# Patient Record
Sex: Female | Born: 1950
Health system: Southern US, Community
[De-identification: ages and names within clinical notes are randomized; demographics above are authoritative.]

---

## 2011-09-16 ENCOUNTER — Inpatient Hospital Stay (INDEPENDENT_AMBULATORY_CARE_PROVIDER_SITE_OTHER)
Admission: RE | Admit: 2011-09-16 | Discharge: 2011-09-16 | Disposition: A | Discharge status: Home / Self Care | Payer: Self-pay | Source: Ambulatory Visit | Attending: Family Medicine | Admitting: Family Medicine

## 2011-09-16 DIAGNOSIS — N390 Urinary tract infection, site not specified: Secondary | ICD-10-CM

## 2011-09-16 LAB — POCT URINALYSIS DIP (DEVICE)
Nitrite: NEGATIVE
Protein, ur: 30 mg/dL — AB
pH: 6 (ref 5.0–8.0)

## 2011-12-22 ENCOUNTER — Ambulatory Visit (INDEPENDENT_AMBULATORY_CARE_PROVIDER_SITE_OTHER)

## 2011-12-22 DIAGNOSIS — J019 Acute sinusitis, unspecified: Secondary | ICD-10-CM

## 2011-12-22 DIAGNOSIS — Z13220 Encounter for screening for lipoid disorders: Secondary | ICD-10-CM

## 2014-03-20 ENCOUNTER — Emergency Department (HOSPITAL_BASED_OUTPATIENT_CLINIC_OR_DEPARTMENT_OTHER)

## 2014-03-20 ENCOUNTER — Encounter (HOSPITAL_BASED_OUTPATIENT_CLINIC_OR_DEPARTMENT_OTHER): Payer: Self-pay | Admitting: Emergency Medicine

## 2014-03-20 ENCOUNTER — Emergency Department (HOSPITAL_BASED_OUTPATIENT_CLINIC_OR_DEPARTMENT_OTHER)
Admission: EM | Admit: 2014-03-20 | Discharge: 2014-03-20 | Disposition: A | Discharge status: Home / Self Care | Attending: Emergency Medicine | Admitting: Emergency Medicine

## 2014-03-20 DIAGNOSIS — R079 Chest pain, unspecified: Secondary | ICD-10-CM

## 2014-03-20 DIAGNOSIS — Z8639 Personal history of other endocrine, nutritional and metabolic disease: Secondary | ICD-10-CM | POA: Insufficient documentation

## 2014-03-20 DIAGNOSIS — Z862 Personal history of diseases of the blood and blood-forming organs and certain disorders involving the immune mechanism: Secondary | ICD-10-CM | POA: Insufficient documentation

## 2014-03-20 DIAGNOSIS — R072 Precordial pain: Secondary | ICD-10-CM | POA: Insufficient documentation

## 2014-03-20 LAB — COMPREHENSIVE METABOLIC PANEL
ALBUMIN: 3.7 g/dL (ref 3.5–5.2)
ALK PHOS: 55 U/L (ref 39–117)
ALT: 21 U/L (ref 0–35)
AST: 24 U/L (ref 0–37)
BILIRUBIN TOTAL: 0.2 mg/dL — AB (ref 0.3–1.2)
BUN: 18 mg/dL (ref 6–23)
CHLORIDE: 106 meq/L (ref 96–112)
CO2: 25 meq/L (ref 19–32)
CREATININE: 0.9 mg/dL (ref 0.50–1.10)
Calcium: 9.5 mg/dL (ref 8.4–10.5)
GFR calc Af Amer: 78 mL/min — ABNORMAL LOW (ref >90)
GFR, EST NON AFRICAN AMERICAN: 67 mL/min — AB (ref >90)
Glucose, Bld: 87 mg/dL (ref 70–99)
POTASSIUM: 4.6 meq/L (ref 3.7–5.3)
Sodium: 142 mEq/L (ref 137–147)
Total Protein: 7.4 g/dL (ref 6.0–8.3)

## 2014-03-20 LAB — CBC
HEMATOCRIT: 36.2 % (ref 36.0–46.0)
Hemoglobin: 12.5 g/dL (ref 12.0–15.0)
MCH: 26.5 pg (ref 26.0–34.0)
MCHC: 34.5 g/dL (ref 30.0–36.0)
MCV: 76.7 fL — AB (ref 78.0–100.0)
Platelets: 182 10*3/uL (ref 150–400)
RBC: 4.72 MIL/uL (ref 3.87–5.11)
RDW: 13.7 % (ref 11.5–15.5)
WBC: 5.1 10*3/uL (ref 4.0–10.5)

## 2014-03-20 LAB — PROTIME-INR
INR: 0.94 (ref 0.00–1.49)
PROTHROMBIN TIME: 12.4 s (ref 11.6–15.2)

## 2014-03-20 LAB — TROPONIN I: Troponin I: <0.3 ng/mL (ref <0.30)

## 2014-03-20 LAB — APTT: APTT: 36 s (ref 24–37)

## 2014-03-20 MED ORDER — SODIUM CHLORIDE 0.9 % IV SOLN
1000.0000 mL | INTRAVENOUS | Status: DC
  Administered 2014-03-20: 1000 mL via INTRAVENOUS

## 2014-03-20 MED ORDER — ASPIRIN 81 MG PO CHEW: 81.0000 mg | CHEWABLE_TABLET | Freq: Every day | ORAL | Status: AC

## 2014-03-20 MED ORDER — ASPIRIN 81 MG PO CHEW
324.0000 mg | CHEWABLE_TABLET | Freq: Once | ORAL | Status: AC
  Administered 2014-03-20: 324 mg via ORAL
  Filled 2014-03-20: qty 4

## 2014-03-20 NOTE — ED Notes (Signed)
Patient transported to X-ray 

## 2014-03-20 NOTE — ED Provider Notes (Signed)
CSN: [id number]     Arrival date & time 03/20/14  1519 History   First MD Initiated Contact with Patient 03/20/14 1535     Chief Complaint  Patient presents with  . Chest Pain    Patient is a 63 y.o. female presenting with chest pain. The history is provided by the patient.  Chest Pain Pain location:  Substernal area Pain quality comment:  It feels heavy, she feels her heart swooshing and it feels sharp Pain radiates to:  L jaw and L arm Pain radiates to the back: no   Pain severity:  Moderate Duration:  2 weeks (It started two weeks ago.  It lasts a few seconds a time.   It happens a few times per day.) Timing:  Intermittent (However she has had a constant dull pain for the last two weeks that has not gone away.) Chronicity:  New Exacerbated by: not deep breathing or movements. She also noticed that her left side started to feel numb.  History reviewed. No pertinent past medical history. History reviewed. No pertinent past surgical history. History reviewed. No pertinent family history. History  Substance Use Topics  . Smoking status: Never Smoker   . Smokeless tobacco: Not on file  . Alcohol Use: Not on file   OB History   Grav Para Term Preterm Abortions TAB SAB Ect Mult Living                 Review of Systems  Cardiovascular: Positive for chest pain.  All other systems reviewed and are negative.     Allergies  Review of patient's allergies indicates no known allergies.  Home Medications   Current Outpatient Rx  Name  Route  Sig  Dispense  Refill  . aspirin 81 MG chewable tablet   Oral   Chew 1 tablet (81 mg total) by mouth daily.   30 tablet   0    BP 145/59  Pulse 75  Temp(Src) 97.9 F (36.6 C) (Oral)  Resp 18  Ht 5\' 2"  (1.575 m)  Wt 130 lb (58.968 kg)  BMI 23.77 kg/m2  SpO2 100% Physical Exam  Nursing note and vitals reviewed. Constitutional: She appears well-developed and well-nourished. No distress.  HENT:  Head: Normocephalic and  atraumatic.  Right Ear: External ear normal.  Left Ear: External ear normal.  Eyes: Conjunctivae are normal. Right eye exhibits no discharge. Left eye exhibits no discharge. No scleral icterus.  Neck: Neck supple. No tracheal deviation present.  Cardiovascular: Normal rate, regular rhythm and intact distal pulses.   Pulmonary/Chest: Effort normal and breath sounds normal. No stridor. No respiratory distress. She has no wheezes. She has no rales.  Abdominal: Soft. Bowel sounds are normal. She exhibits no distension. There is no tenderness. There is no rebound and no guarding.  Musculoskeletal: She exhibits no edema and no tenderness.  Neurological: She is alert. She has normal strength. No cranial nerve deficit (no facial droop, extraocular movements intact, no slurred speech) or sensory deficit. She exhibits normal muscle tone. She displays no seizure activity. Coordination normal.  Skin: Skin is warm and dry. No rash noted.  Psychiatric: She has a normal mood and affect.    ED Course  Procedures (including critical care time) Labs Review Labs Reviewed  CBC - Abnormal; Notable for the following:    MCV 76.7 (*)    All other components within normal limits  COMPREHENSIVE METABOLIC PANEL - Abnormal; Notable for the following:    Total Bilirubin 0.2 (*)  GFR calc non Af Amer 67 (*)    GFR calc Af Amer 78 (*)    All other components within normal limits  APTT  PROTIME-INR  TROPONIN I   Imaging Review Dg Chest 2 View  03/20/2014   CLINICAL DATA:  CHEST PAIN  EXAM: CHEST  2 VIEW  COMPARISON:  None.  FINDINGS: The heart size and mediastinal contours are within normal limits. Both lungs are clear. The visualized skeletal structures are unremarkable.  IMPRESSION: No active cardiopulmonary disease.   Electronically Signed   By: Salome HolmesHector  Cooper M.D.   On: 03/20/2014 16:43     EKG Interpretation   Date/Time:  Friday March 20 2014 15:31:09 EDT Ventricular Rate:  64 PR Interval:  128 QRS  Duration: 102 QT Interval:  418 QTC Calculation: 431 R Axis:   87 Text Interpretation:  Normal sinus rhythm Normal ECG No old tracing to  compare Confirmed by Derl Abalos  MD-J, Keiyana Stehr ([id number]) on 03/20/2014 3:38:24 PM      MDM   Final diagnoses:  Chest pain    The patient describes atypical symptoms of chest pain. Initially she was describing intermittent sharp episodes associated with pressure and fluttering in her chest. Her she has had constant chest discomfort for the last 2 that has never gone away. Patient has minimal cardiac risk factors. She does not smoke. There is no family history. She does not have hypertension or diabetes. She does have history of high cholesterol but does not take any medications for it.  With her normal EKG and cardiac enzymes and her mild symptoms I have low suspicion for acute coronary syndrome. I doubt pulmonary embolism.  At this time there does not appear to be any evidence of an acute emergency medical condition and the patient appears stable for discharge with appropriate outpatient follow up.     Celene KrasJon R Kiannah Grunow, MD 03/20/14 [phone]

## 2014-03-20 NOTE — Discharge Instructions (Signed)
Chest Pain (Nonspecific) °It is often hard to give a specific diagnosis for the cause of chest pain. There is always a chance that your pain could be related to something serious, such as a heart attack or a blood clot in the lungs. You need to follow up with your caregiver for further evaluation. °CAUSES  °· Heartburn. °· Pneumonia or bronchitis. °· Anxiety or stress. °· Inflammation around your heart (pericarditis) or lung (pleuritis or pleurisy). °· A blood clot in the lung. °· A collapsed lung (pneumothorax). It can develop suddenly on its own (spontaneous pneumothorax) or from injury (trauma) to the chest. °· Shingles infection (herpes zoster virus). °The chest wall is composed of bones, muscles, and cartilage. Any of these can be the source of the pain. °· The bones can be bruised by injury. °· The muscles or cartilage can be strained by coughing or overwork. °· The cartilage can be affected by inflammation and become sore (costochondritis). °DIAGNOSIS  °Lab tests or other studies, such as X-rays, electrocardiography, stress testing, or cardiac imaging, may be needed to find the cause of your pain.  °TREATMENT  °· Treatment depends on what may be causing your chest pain. Treatment may include: °· Acid blockers for heartburn. °· Anti-inflammatory medicine. °· Pain medicine for inflammatory conditions. °· Antibiotics if an infection is present. °· You may be advised to change lifestyle habits. This includes stopping smoking and avoiding alcohol, caffeine, and chocolate. °· You may be advised to keep your head raised (elevated) when sleeping. This reduces the chance of acid going backward from your stomach into your esophagus. °· Most of the time, nonspecific chest pain will improve within 2 to 3 days with rest and mild pain medicine. °HOME CARE INSTRUCTIONS  °· If antibiotics were prescribed, take your antibiotics as directed. Finish them even if you start to feel better. °· For the next few days, avoid physical  activities that bring on chest pain. Continue physical activities as directed. °· Do not smoke. °· Avoid drinking alcohol. °· Only take over-the-counter or prescription medicine for pain, discomfort, or fever as directed by your caregiver. °· Follow your caregiver's suggestions for further testing if your chest pain does not go away. °· Keep any follow-up appointments you made. If you do not go to an appointment, you could develop lasting (chronic) problems with pain. If there is any problem keeping an appointment, you must call to reschedule. °SEEK MEDICAL CARE IF:  °· You think you are having problems from the medicine you are taking. Read your medicine instructions carefully. °· Your chest pain does not go away, even after treatment. °· You develop a rash with blisters on your chest. °SEEK IMMEDIATE MEDICAL CARE IF:  °· You have increased chest pain or pain that spreads to your arm, neck, jaw, back, or abdomen. °· You develop shortness of breath, an increasing cough, or you are coughing up blood. °· You have severe back or abdominal pain, feel nauseous, or vomit. °· You develop severe weakness, fainting, or chills. °· You have a fever. °THIS IS AN EMERGENCY. Do not wait to see if the pain will go away. Get medical help at once. Call your local emergency services (911 in [place] Do not drive yourself to the hospital. °MAKE SURE YOU:  °· Understand these instructions. °· Will watch your condition. °· Will get help right away if you are not doing well or get worse. °Document Released: 09/06/2005 Document Revised: 02/19/2012 Document Reviewed: 07/02/2008 °[employer]® Patient Information ©2014 [employer]. ° °

## 2014-03-20 NOTE — ED Notes (Signed)
Pt amb to room 9 with quick steady gait in nad. Pt reports 2 weeks of intermittent chest pain, "Heavy", pt reports left sided cp radiating to left jaw and left shoulder, and back. ekg in progress, iv access obtained.

## 2016-02-26 LAB — POCT LIPID PANEL
HDL: 83
LDL: 126
TC: 232
TRG: 116

## 2016-02-26 LAB — GLUCOSE, POCT (MANUAL RESULT ENTRY): POC GLUCOSE: 83 mg/dL (ref 70–99)

## 2016-12-18 DIAGNOSIS — M25561 Pain in right knee: Secondary | ICD-10-CM | POA: Diagnosis not present

## 2016-12-18 DIAGNOSIS — M25562 Pain in left knee: Secondary | ICD-10-CM | POA: Diagnosis not present

## 2016-12-18 DIAGNOSIS — J302 Other seasonal allergic rhinitis: Secondary | ICD-10-CM | POA: Diagnosis not present

## 2016-12-18 DIAGNOSIS — M25541 Pain in joints of right hand: Secondary | ICD-10-CM | POA: Diagnosis not present

## 2016-12-18 DIAGNOSIS — E559 Vitamin D deficiency, unspecified: Secondary | ICD-10-CM | POA: Diagnosis not present

## 2016-12-18 DIAGNOSIS — E785 Hyperlipidemia, unspecified: Secondary | ICD-10-CM | POA: Diagnosis not present

## 2016-12-18 DIAGNOSIS — F418 Other specified anxiety disorders: Secondary | ICD-10-CM | POA: Diagnosis not present

## 2016-12-18 DIAGNOSIS — K219 Gastro-esophageal reflux disease without esophagitis: Secondary | ICD-10-CM | POA: Diagnosis not present

## 2017-01-10 DIAGNOSIS — H52223 Regular astigmatism, bilateral: Secondary | ICD-10-CM | POA: Diagnosis not present

## 2017-01-10 DIAGNOSIS — H524 Presbyopia: Secondary | ICD-10-CM | POA: Diagnosis not present

## 2017-01-10 DIAGNOSIS — H2513 Age-related nuclear cataract, bilateral: Secondary | ICD-10-CM | POA: Diagnosis not present

## 2017-01-29 DIAGNOSIS — E559 Vitamin D deficiency, unspecified: Secondary | ICD-10-CM | POA: Diagnosis not present

## 2017-01-29 DIAGNOSIS — F418 Other specified anxiety disorders: Secondary | ICD-10-CM | POA: Diagnosis not present

## 2017-01-29 DIAGNOSIS — Z131 Encounter for screening for diabetes mellitus: Secondary | ICD-10-CM | POA: Diagnosis not present

## 2017-01-29 DIAGNOSIS — K219 Gastro-esophageal reflux disease without esophagitis: Secondary | ICD-10-CM | POA: Diagnosis not present

## 2017-01-29 DIAGNOSIS — E785 Hyperlipidemia, unspecified: Secondary | ICD-10-CM | POA: Diagnosis not present

## 2017-01-29 DIAGNOSIS — Z0000 Encounter for general adult medical examination without abnormal findings: Secondary | ICD-10-CM | POA: Diagnosis not present

## 2017-01-29 DIAGNOSIS — J302 Other seasonal allergic rhinitis: Secondary | ICD-10-CM | POA: Diagnosis not present

## 2017-01-29 DIAGNOSIS — R51 Headache: Secondary | ICD-10-CM | POA: Diagnosis not present

## 2017-05-10 DIAGNOSIS — R300 Dysuria: Secondary | ICD-10-CM | POA: Diagnosis not present

## 2017-05-10 DIAGNOSIS — E559 Vitamin D deficiency, unspecified: Secondary | ICD-10-CM | POA: Diagnosis not present

## 2017-05-10 DIAGNOSIS — R110 Nausea: Secondary | ICD-10-CM | POA: Diagnosis not present

## 2017-05-10 DIAGNOSIS — J329 Chronic sinusitis, unspecified: Secondary | ICD-10-CM | POA: Diagnosis not present

## 2017-05-10 DIAGNOSIS — F418 Other specified anxiety disorders: Secondary | ICD-10-CM | POA: Diagnosis not present

## 2017-05-10 DIAGNOSIS — K219 Gastro-esophageal reflux disease without esophagitis: Secondary | ICD-10-CM | POA: Diagnosis not present

## 2017-05-10 DIAGNOSIS — E785 Hyperlipidemia, unspecified: Secondary | ICD-10-CM | POA: Diagnosis not present

## 2017-05-10 DIAGNOSIS — J302 Other seasonal allergic rhinitis: Secondary | ICD-10-CM | POA: Diagnosis not present

## 2017-06-26 DIAGNOSIS — Z131 Encounter for screening for diabetes mellitus: Secondary | ICD-10-CM | POA: Diagnosis not present

## 2017-06-26 DIAGNOSIS — R3915 Urgency of urination: Secondary | ICD-10-CM | POA: Diagnosis not present

## 2017-06-26 DIAGNOSIS — G44209 Tension-type headache, unspecified, not intractable: Secondary | ICD-10-CM | POA: Diagnosis not present

## 2017-06-26 DIAGNOSIS — Z0110 Encounter for examination of ears and hearing without abnormal findings: Secondary | ICD-10-CM | POA: Diagnosis not present

## 2017-06-26 DIAGNOSIS — E785 Hyperlipidemia, unspecified: Secondary | ICD-10-CM | POA: Diagnosis not present

## 2017-06-26 DIAGNOSIS — J302 Other seasonal allergic rhinitis: Secondary | ICD-10-CM | POA: Diagnosis not present

## 2017-06-26 DIAGNOSIS — K219 Gastro-esophageal reflux disease without esophagitis: Secondary | ICD-10-CM | POA: Diagnosis not present

## 2017-06-26 DIAGNOSIS — E559 Vitamin D deficiency, unspecified: Secondary | ICD-10-CM | POA: Diagnosis not present

## 2017-06-26 DIAGNOSIS — H9209 Otalgia, unspecified ear: Secondary | ICD-10-CM | POA: Diagnosis not present

## 2017-06-26 DIAGNOSIS — R002 Palpitations: Secondary | ICD-10-CM | POA: Diagnosis not present

## 2017-06-26 DIAGNOSIS — F418 Other specified anxiety disorders: Secondary | ICD-10-CM | POA: Diagnosis not present

## 2017-06-26 DIAGNOSIS — H539 Unspecified visual disturbance: Secondary | ICD-10-CM | POA: Diagnosis not present

## 2017-06-26 DIAGNOSIS — R9431 Abnormal electrocardiogram [ECG] [EKG]: Secondary | ICD-10-CM | POA: Diagnosis not present

## 2017-07-17 DIAGNOSIS — E785 Hyperlipidemia, unspecified: Secondary | ICD-10-CM | POA: Diagnosis not present

## 2017-07-17 DIAGNOSIS — K219 Gastro-esophageal reflux disease without esophagitis: Secondary | ICD-10-CM | POA: Diagnosis not present

## 2017-07-17 DIAGNOSIS — R740 Nonspecific elevation of levels of transaminase and lactic acid dehydrogenase [LDH]: Secondary | ICD-10-CM | POA: Diagnosis not present

## 2017-07-17 DIAGNOSIS — E559 Vitamin D deficiency, unspecified: Secondary | ICD-10-CM | POA: Diagnosis not present

## 2017-07-17 DIAGNOSIS — J302 Other seasonal allergic rhinitis: Secondary | ICD-10-CM | POA: Diagnosis not present

## 2017-07-17 DIAGNOSIS — F418 Other specified anxiety disorders: Secondary | ICD-10-CM | POA: Diagnosis not present

## 2017-07-17 DIAGNOSIS — R7989 Other specified abnormal findings of blood chemistry: Secondary | ICD-10-CM | POA: Diagnosis not present

## 2017-07-17 DIAGNOSIS — G44209 Tension-type headache, unspecified, not intractable: Secondary | ICD-10-CM | POA: Diagnosis not present

## 2017-07-20 DIAGNOSIS — E559 Vitamin D deficiency, unspecified: Secondary | ICD-10-CM | POA: Diagnosis not present

## 2017-07-20 DIAGNOSIS — F418 Other specified anxiety disorders: Secondary | ICD-10-CM | POA: Diagnosis not present

## 2017-07-20 DIAGNOSIS — E875 Hyperkalemia: Secondary | ICD-10-CM | POA: Diagnosis not present

## 2017-07-20 DIAGNOSIS — R7989 Other specified abnormal findings of blood chemistry: Secondary | ICD-10-CM | POA: Diagnosis not present

## 2017-07-20 DIAGNOSIS — K219 Gastro-esophageal reflux disease without esophagitis: Secondary | ICD-10-CM | POA: Diagnosis not present

## 2017-07-20 DIAGNOSIS — G44209 Tension-type headache, unspecified, not intractable: Secondary | ICD-10-CM | POA: Diagnosis not present

## 2017-07-20 DIAGNOSIS — R300 Dysuria: Secondary | ICD-10-CM | POA: Diagnosis not present

## 2017-07-20 DIAGNOSIS — R740 Nonspecific elevation of levels of transaminase and lactic acid dehydrogenase [LDH]: Secondary | ICD-10-CM | POA: Diagnosis not present

## 2017-07-20 DIAGNOSIS — E785 Hyperlipidemia, unspecified: Secondary | ICD-10-CM | POA: Diagnosis not present

## 2017-07-20 DIAGNOSIS — J302 Other seasonal allergic rhinitis: Secondary | ICD-10-CM | POA: Diagnosis not present

## 2017-08-02 DIAGNOSIS — Z1231 Encounter for screening mammogram for malignant neoplasm of breast: Secondary | ICD-10-CM | POA: Diagnosis not present

## 2017-08-30 DIAGNOSIS — R0609 Other forms of dyspnea: Secondary | ICD-10-CM | POA: Diagnosis not present

## 2017-08-30 DIAGNOSIS — R9431 Abnormal electrocardiogram [ECG] [EKG]: Secondary | ICD-10-CM | POA: Diagnosis not present

## 2017-08-31 DIAGNOSIS — Z23 Encounter for immunization: Secondary | ICD-10-CM | POA: Diagnosis not present

## 2017-09-04 DIAGNOSIS — R740 Nonspecific elevation of levels of transaminase and lactic acid dehydrogenase [LDH]: Secondary | ICD-10-CM | POA: Diagnosis not present

## 2017-09-04 DIAGNOSIS — J302 Other seasonal allergic rhinitis: Secondary | ICD-10-CM | POA: Diagnosis not present

## 2017-09-04 DIAGNOSIS — E785 Hyperlipidemia, unspecified: Secondary | ICD-10-CM | POA: Diagnosis not present

## 2017-09-04 DIAGNOSIS — F418 Other specified anxiety disorders: Secondary | ICD-10-CM | POA: Diagnosis not present

## 2017-09-04 DIAGNOSIS — K921 Melena: Secondary | ICD-10-CM | POA: Diagnosis not present

## 2017-09-04 DIAGNOSIS — G44209 Tension-type headache, unspecified, not intractable: Secondary | ICD-10-CM | POA: Diagnosis not present

## 2017-09-04 DIAGNOSIS — K219 Gastro-esophageal reflux disease without esophagitis: Secondary | ICD-10-CM | POA: Diagnosis not present

## 2017-09-04 DIAGNOSIS — J329 Chronic sinusitis, unspecified: Secondary | ICD-10-CM | POA: Diagnosis not present

## 2017-09-04 DIAGNOSIS — E559 Vitamin D deficiency, unspecified: Secondary | ICD-10-CM | POA: Diagnosis not present

## 2017-09-11 DIAGNOSIS — R635 Abnormal weight gain: Secondary | ICD-10-CM | POA: Diagnosis not present

## 2017-09-11 DIAGNOSIS — K602 Anal fissure, unspecified: Secondary | ICD-10-CM | POA: Diagnosis not present

## 2017-09-11 DIAGNOSIS — K6289 Other specified diseases of anus and rectum: Secondary | ICD-10-CM | POA: Diagnosis not present

## 2017-09-11 DIAGNOSIS — K5904 Chronic idiopathic constipation: Secondary | ICD-10-CM | POA: Diagnosis not present

## 2017-09-11 DIAGNOSIS — R110 Nausea: Secondary | ICD-10-CM | POA: Diagnosis not present

## 2017-09-11 DIAGNOSIS — K625 Hemorrhage of anus and rectum: Secondary | ICD-10-CM | POA: Diagnosis not present

## 2017-11-06 DIAGNOSIS — J329 Chronic sinusitis, unspecified: Secondary | ICD-10-CM | POA: Diagnosis not present

## 2017-11-06 DIAGNOSIS — E559 Vitamin D deficiency, unspecified: Secondary | ICD-10-CM | POA: Diagnosis not present

## 2017-11-06 DIAGNOSIS — J302 Other seasonal allergic rhinitis: Secondary | ICD-10-CM | POA: Diagnosis not present

## 2017-11-06 DIAGNOSIS — E785 Hyperlipidemia, unspecified: Secondary | ICD-10-CM | POA: Diagnosis not present

## 2017-11-06 DIAGNOSIS — R740 Nonspecific elevation of levels of transaminase and lactic acid dehydrogenase [LDH]: Secondary | ICD-10-CM | POA: Diagnosis not present

## 2017-11-06 DIAGNOSIS — F418 Other specified anxiety disorders: Secondary | ICD-10-CM | POA: Diagnosis not present

## 2017-11-06 DIAGNOSIS — G44209 Tension-type headache, unspecified, not intractable: Secondary | ICD-10-CM | POA: Diagnosis not present

## 2017-11-06 DIAGNOSIS — K219 Gastro-esophageal reflux disease without esophagitis: Secondary | ICD-10-CM | POA: Diagnosis not present

## 2017-11-06 DIAGNOSIS — R7989 Other specified abnormal findings of blood chemistry: Secondary | ICD-10-CM | POA: Diagnosis not present

## 2017-11-27 DIAGNOSIS — J302 Other seasonal allergic rhinitis: Secondary | ICD-10-CM | POA: Diagnosis not present

## 2017-11-27 DIAGNOSIS — K219 Gastro-esophageal reflux disease without esophagitis: Secondary | ICD-10-CM | POA: Diagnosis not present

## 2017-11-27 DIAGNOSIS — G44209 Tension-type headache, unspecified, not intractable: Secondary | ICD-10-CM | POA: Diagnosis not present

## 2017-11-27 DIAGNOSIS — E785 Hyperlipidemia, unspecified: Secondary | ICD-10-CM | POA: Diagnosis not present

## 2017-11-27 DIAGNOSIS — F418 Other specified anxiety disorders: Secondary | ICD-10-CM | POA: Diagnosis not present

## 2017-11-27 DIAGNOSIS — R7989 Other specified abnormal findings of blood chemistry: Secondary | ICD-10-CM | POA: Diagnosis not present

## 2017-11-27 DIAGNOSIS — R740 Nonspecific elevation of levels of transaminase and lactic acid dehydrogenase [LDH]: Secondary | ICD-10-CM | POA: Diagnosis not present

## 2017-11-27 DIAGNOSIS — E559 Vitamin D deficiency, unspecified: Secondary | ICD-10-CM | POA: Diagnosis not present

## 2017-11-27 DIAGNOSIS — J329 Chronic sinusitis, unspecified: Secondary | ICD-10-CM | POA: Diagnosis not present

## 2018-01-29 DIAGNOSIS — Z0000 Encounter for general adult medical examination without abnormal findings: Secondary | ICD-10-CM | POA: Diagnosis not present

## 2018-01-29 DIAGNOSIS — K219 Gastro-esophageal reflux disease without esophagitis: Secondary | ICD-10-CM | POA: Diagnosis not present

## 2018-01-29 DIAGNOSIS — E785 Hyperlipidemia, unspecified: Secondary | ICD-10-CM | POA: Diagnosis not present

## 2018-01-29 DIAGNOSIS — E559 Vitamin D deficiency, unspecified: Secondary | ICD-10-CM | POA: Diagnosis not present

## 2018-01-29 DIAGNOSIS — J302 Other seasonal allergic rhinitis: Secondary | ICD-10-CM | POA: Diagnosis not present

## 2018-01-29 DIAGNOSIS — F418 Other specified anxiety disorders: Secondary | ICD-10-CM | POA: Diagnosis not present

## 2018-03-18 DIAGNOSIS — F418 Other specified anxiety disorders: Secondary | ICD-10-CM | POA: Diagnosis not present

## 2018-03-18 DIAGNOSIS — K219 Gastro-esophageal reflux disease without esophagitis: Secondary | ICD-10-CM | POA: Diagnosis not present

## 2018-03-18 DIAGNOSIS — E785 Hyperlipidemia, unspecified: Secondary | ICD-10-CM | POA: Diagnosis not present

## 2018-03-18 DIAGNOSIS — E559 Vitamin D deficiency, unspecified: Secondary | ICD-10-CM | POA: Diagnosis not present

## 2018-03-18 DIAGNOSIS — J302 Other seasonal allergic rhinitis: Secondary | ICD-10-CM | POA: Diagnosis not present

## 2018-03-18 DIAGNOSIS — N390 Urinary tract infection, site not specified: Secondary | ICD-10-CM | POA: Diagnosis not present

## 2018-03-28 DIAGNOSIS — H16213 Exposure keratoconjunctivitis, bilateral: Secondary | ICD-10-CM | POA: Diagnosis not present

## 2018-03-28 DIAGNOSIS — H04123 Dry eye syndrome of bilateral lacrimal glands: Secondary | ICD-10-CM | POA: Diagnosis not present

## 2018-04-17 DIAGNOSIS — J069 Acute upper respiratory infection, unspecified: Secondary | ICD-10-CM | POA: Diagnosis not present

## 2018-04-17 DIAGNOSIS — K219 Gastro-esophageal reflux disease without esophagitis: Secondary | ICD-10-CM | POA: Diagnosis not present

## 2018-04-17 DIAGNOSIS — E785 Hyperlipidemia, unspecified: Secondary | ICD-10-CM | POA: Diagnosis not present

## 2018-04-17 DIAGNOSIS — J302 Other seasonal allergic rhinitis: Secondary | ICD-10-CM | POA: Diagnosis not present

## 2018-04-17 DIAGNOSIS — F418 Other specified anxiety disorders: Secondary | ICD-10-CM | POA: Diagnosis not present

## 2018-04-17 DIAGNOSIS — R3915 Urgency of urination: Secondary | ICD-10-CM | POA: Diagnosis not present

## 2018-04-17 DIAGNOSIS — E559 Vitamin D deficiency, unspecified: Secondary | ICD-10-CM | POA: Diagnosis not present

## 2018-05-29 DIAGNOSIS — K219 Gastro-esophageal reflux disease without esophagitis: Secondary | ICD-10-CM | POA: Diagnosis not present

## 2018-05-29 DIAGNOSIS — F418 Other specified anxiety disorders: Secondary | ICD-10-CM | POA: Diagnosis not present

## 2018-05-29 DIAGNOSIS — E559 Vitamin D deficiency, unspecified: Secondary | ICD-10-CM | POA: Diagnosis not present

## 2018-05-29 DIAGNOSIS — E785 Hyperlipidemia, unspecified: Secondary | ICD-10-CM | POA: Diagnosis not present

## 2018-05-29 DIAGNOSIS — J302 Other seasonal allergic rhinitis: Secondary | ICD-10-CM | POA: Diagnosis not present

## 2018-06-20 DIAGNOSIS — R3915 Urgency of urination: Secondary | ICD-10-CM | POA: Diagnosis not present

## 2018-06-27 DIAGNOSIS — Z136 Encounter for screening for cardiovascular disorders: Secondary | ICD-10-CM | POA: Diagnosis not present

## 2018-06-27 DIAGNOSIS — E785 Hyperlipidemia, unspecified: Secondary | ICD-10-CM | POA: Diagnosis not present

## 2018-06-27 DIAGNOSIS — Z131 Encounter for screening for diabetes mellitus: Secondary | ICD-10-CM | POA: Diagnosis not present

## 2018-06-27 DIAGNOSIS — Z0100 Encounter for examination of eyes and vision without abnormal findings: Secondary | ICD-10-CM | POA: Diagnosis not present

## 2018-06-27 DIAGNOSIS — E559 Vitamin D deficiency, unspecified: Secondary | ICD-10-CM | POA: Diagnosis not present

## 2018-06-27 DIAGNOSIS — Z1389 Encounter for screening for other disorder: Secondary | ICD-10-CM | POA: Diagnosis not present

## 2018-06-27 DIAGNOSIS — J302 Other seasonal allergic rhinitis: Secondary | ICD-10-CM | POA: Diagnosis not present

## 2018-06-27 DIAGNOSIS — K219 Gastro-esophageal reflux disease without esophagitis: Secondary | ICD-10-CM | POA: Diagnosis not present

## 2018-06-27 DIAGNOSIS — F418 Other specified anxiety disorders: Secondary | ICD-10-CM | POA: Diagnosis not present

## 2018-06-27 DIAGNOSIS — Z0110 Encounter for examination of ears and hearing without abnormal findings: Secondary | ICD-10-CM | POA: Diagnosis not present

## 2018-07-03 ENCOUNTER — Emergency Department (HOSPITAL_COMMUNITY)
Admission: EM | Admit: 2018-07-03 | Discharge: 2018-07-03 | Disposition: A | Discharge status: Home / Self Care | Payer: Medicare Other | Attending: Emergency Medicine | Admitting: Emergency Medicine

## 2018-07-03 ENCOUNTER — Emergency Department (HOSPITAL_COMMUNITY): Payer: Medicare Other

## 2018-07-03 ENCOUNTER — Encounter (HOSPITAL_COMMUNITY): Payer: Self-pay | Admitting: *Deleted

## 2018-07-03 DIAGNOSIS — Y999 Unspecified external cause status: Secondary | ICD-10-CM | POA: Diagnosis not present

## 2018-07-03 DIAGNOSIS — R0789 Other chest pain: Secondary | ICD-10-CM | POA: Diagnosis not present

## 2018-07-03 DIAGNOSIS — Z7982 Long term (current) use of aspirin: Secondary | ICD-10-CM | POA: Diagnosis not present

## 2018-07-03 DIAGNOSIS — S40021A Contusion of right upper arm, initial encounter: Secondary | ICD-10-CM | POA: Diagnosis not present

## 2018-07-03 DIAGNOSIS — S40022A Contusion of left upper arm, initial encounter: Secondary | ICD-10-CM | POA: Diagnosis not present

## 2018-07-03 DIAGNOSIS — Y929 Unspecified place or not applicable: Secondary | ICD-10-CM | POA: Diagnosis not present

## 2018-07-03 DIAGNOSIS — R1031 Right lower quadrant pain: Secondary | ICD-10-CM | POA: Insufficient documentation

## 2018-07-03 DIAGNOSIS — S8992XA Unspecified injury of left lower leg, initial encounter: Secondary | ICD-10-CM | POA: Diagnosis not present

## 2018-07-03 DIAGNOSIS — S8002XA Contusion of left knee, initial encounter: Secondary | ICD-10-CM | POA: Diagnosis not present

## 2018-07-03 DIAGNOSIS — Z79899 Other long term (current) drug therapy: Secondary | ICD-10-CM | POA: Insufficient documentation

## 2018-07-03 DIAGNOSIS — M25562 Pain in left knee: Secondary | ICD-10-CM | POA: Diagnosis not present

## 2018-07-03 DIAGNOSIS — M7989 Other specified soft tissue disorders: Secondary | ICD-10-CM | POA: Diagnosis not present

## 2018-07-03 DIAGNOSIS — Y939 Activity, unspecified: Secondary | ICD-10-CM | POA: Insufficient documentation

## 2018-07-03 DIAGNOSIS — S20219A Contusion of unspecified front wall of thorax, initial encounter: Secondary | ICD-10-CM | POA: Diagnosis not present

## 2018-07-03 DIAGNOSIS — S301XXA Contusion of abdominal wall, initial encounter: Secondary | ICD-10-CM | POA: Diagnosis not present

## 2018-07-03 LAB — I-STAT CHEM 8, ED
BUN: 17 mg/dL (ref 8–23)
CALCIUM ION: 1.11 mmol/L — AB (ref 1.15–1.40)
CHLORIDE: 108 mmol/L (ref 98–111)
Creatinine, Ser: 0.7 mg/dL (ref 0.44–1.00)
Glucose, Bld: 90 mg/dL (ref 70–99)
HCT: 42 % (ref 36.0–46.0)
Hemoglobin: 14.3 g/dL (ref 12.0–15.0)
POTASSIUM: 5.6 mmol/L — AB (ref 3.5–5.1)
SODIUM: 137 mmol/L (ref 135–145)
TCO2: 25 mmol/L (ref 22–32)

## 2018-07-03 LAB — I-STAT BETA HCG BLOOD, ED (MC, WL, AP ONLY): I-stat hCG, quantitative: <5 m[IU]/mL

## 2018-07-03 MED ORDER — IOPAMIDOL (ISOVUE-300) INJECTION 61%
100.0000 mL | Freq: Once | INTRAVENOUS | Status: AC | PRN
  Administered 2018-07-03: 100 mL via INTRAVENOUS

## 2018-07-03 MED ORDER — ACETAMINOPHEN ER 650 MG PO TBCR: 650.0000 mg | EXTENDED_RELEASE_TABLET | Freq: Three times a day (TID) | ORAL | 0 refills | Status: AC

## 2018-07-03 MED ORDER — HYDROCODONE-ACETAMINOPHEN 5-325 MG PO TABS
1.0000 | ORAL_TABLET | Freq: Once | ORAL | Status: AC
  Administered 2018-07-03: 1 via ORAL
  Filled 2018-07-03: qty 1

## 2018-07-03 MED ORDER — IOPAMIDOL (ISOVUE-300) INJECTION 61%
INTRAVENOUS | Status: AC
  Filled 2018-07-03: qty 100

## 2018-07-03 MED ORDER — ONDANSETRON 4 MG PO TBDP
4.0000 mg | ORAL_TABLET | Freq: Once | ORAL | Status: AC
  Administered 2018-07-03: 4 mg via ORAL
  Filled 2018-07-03: qty 1

## 2018-07-03 NOTE — ED Notes (Signed)
Patient in radiology
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
## 2018-07-03 NOTE — ED Notes (Signed)
Patient transported to CT 
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
## 2018-07-03 NOTE — ED Notes (Signed)
Attempted twice to obtain blood but was unsuccessful. [name], Charity fundraiserN (Press photographercharge nurse) is going to bedside. Also, provided patient two ice packs for her left arm and left knee.

## 2018-07-10 DIAGNOSIS — K921 Melena: Secondary | ICD-10-CM | POA: Diagnosis not present

## 2018-07-10 DIAGNOSIS — J329 Chronic sinusitis, unspecified: Secondary | ICD-10-CM | POA: Diagnosis not present

## 2018-07-17 DIAGNOSIS — E559 Vitamin D deficiency, unspecified: Secondary | ICD-10-CM | POA: Diagnosis not present

## 2018-07-17 DIAGNOSIS — E785 Hyperlipidemia, unspecified: Secondary | ICD-10-CM | POA: Diagnosis not present

## 2018-07-17 DIAGNOSIS — K219 Gastro-esophageal reflux disease without esophagitis: Secondary | ICD-10-CM | POA: Diagnosis not present

## 2018-07-17 DIAGNOSIS — E038 Other specified hypothyroidism: Secondary | ICD-10-CM | POA: Diagnosis not present

## 2018-07-17 DIAGNOSIS — F418 Other specified anxiety disorders: Secondary | ICD-10-CM | POA: Diagnosis not present

## 2018-07-17 DIAGNOSIS — J302 Other seasonal allergic rhinitis: Secondary | ICD-10-CM | POA: Diagnosis not present

## 2018-09-02 DIAGNOSIS — Z1231 Encounter for screening mammogram for malignant neoplasm of breast: Secondary | ICD-10-CM | POA: Diagnosis not present
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
## 2018-09-26 DIAGNOSIS — K219 Gastro-esophageal reflux disease without esophagitis: Secondary | ICD-10-CM | POA: Diagnosis not present

## 2018-09-26 DIAGNOSIS — R05 Cough: Secondary | ICD-10-CM | POA: Diagnosis not present

## 2018-09-26 DIAGNOSIS — E785 Hyperlipidemia, unspecified: Secondary | ICD-10-CM | POA: Diagnosis not present

## 2018-09-26 DIAGNOSIS — F418 Other specified anxiety disorders: Secondary | ICD-10-CM | POA: Diagnosis not present

## 2018-09-26 DIAGNOSIS — E559 Vitamin D deficiency, unspecified: Secondary | ICD-10-CM | POA: Diagnosis not present

## 2018-09-26 DIAGNOSIS — E038 Other specified hypothyroidism: Secondary | ICD-10-CM | POA: Diagnosis not present

## 2018-09-26 DIAGNOSIS — J302 Other seasonal allergic rhinitis: Secondary | ICD-10-CM | POA: Diagnosis not present

## 2018-11-14 DIAGNOSIS — E875 Hyperkalemia: Secondary | ICD-10-CM | POA: Diagnosis not present

## 2018-11-14 DIAGNOSIS — F418 Other specified anxiety disorders: Secondary | ICD-10-CM | POA: Diagnosis not present

## 2018-11-14 DIAGNOSIS — E559 Vitamin D deficiency, unspecified: Secondary | ICD-10-CM | POA: Diagnosis not present

## 2018-11-14 DIAGNOSIS — K219 Gastro-esophageal reflux disease without esophagitis: Secondary | ICD-10-CM | POA: Diagnosis not present

## 2018-11-14 DIAGNOSIS — E7800 Pure hypercholesterolemia, unspecified: Secondary | ICD-10-CM | POA: Diagnosis not present

## 2018-11-14 DIAGNOSIS — J302 Other seasonal allergic rhinitis: Secondary | ICD-10-CM | POA: Diagnosis not present

## 2018-11-14 DIAGNOSIS — E038 Other specified hypothyroidism: Secondary | ICD-10-CM | POA: Diagnosis not present

## 2018-11-14 DIAGNOSIS — I739 Peripheral vascular disease, unspecified: Secondary | ICD-10-CM | POA: Diagnosis not present

## 2018-11-28 DIAGNOSIS — E038 Other specified hypothyroidism: Secondary | ICD-10-CM | POA: Diagnosis not present

## 2018-11-28 DIAGNOSIS — R3915 Urgency of urination: Secondary | ICD-10-CM | POA: Diagnosis not present

## 2018-11-28 DIAGNOSIS — E559 Vitamin D deficiency, unspecified: Secondary | ICD-10-CM | POA: Diagnosis not present

## 2018-11-28 DIAGNOSIS — K219 Gastro-esophageal reflux disease without esophagitis: Secondary | ICD-10-CM | POA: Diagnosis not present

## 2018-11-28 DIAGNOSIS — J302 Other seasonal allergic rhinitis: Secondary | ICD-10-CM | POA: Diagnosis not present

## 2018-11-28 DIAGNOSIS — F418 Other specified anxiety disorders: Secondary | ICD-10-CM | POA: Diagnosis not present

## 2018-11-28 DIAGNOSIS — E785 Hyperlipidemia, unspecified: Secondary | ICD-10-CM | POA: Diagnosis not present

## 2019-02-05 DIAGNOSIS — E559 Vitamin D deficiency, unspecified: Secondary | ICD-10-CM | POA: Diagnosis not present

## 2019-02-05 DIAGNOSIS — E785 Hyperlipidemia, unspecified: Secondary | ICD-10-CM | POA: Diagnosis not present

## 2019-02-05 DIAGNOSIS — R3915 Urgency of urination: Secondary | ICD-10-CM | POA: Diagnosis not present

## 2019-02-05 DIAGNOSIS — J302 Other seasonal allergic rhinitis: Secondary | ICD-10-CM | POA: Diagnosis not present

## 2019-02-05 DIAGNOSIS — Z0001 Encounter for general adult medical examination with abnormal findings: Secondary | ICD-10-CM | POA: Diagnosis not present

## 2019-02-05 DIAGNOSIS — E038 Other specified hypothyroidism: Secondary | ICD-10-CM | POA: Diagnosis not present

## 2019-02-05 DIAGNOSIS — Z0000 Encounter for general adult medical examination without abnormal findings: Secondary | ICD-10-CM | POA: Diagnosis not present

## 2019-02-05 DIAGNOSIS — M25562 Pain in left knee: Secondary | ICD-10-CM | POA: Diagnosis not present

## 2019-02-05 DIAGNOSIS — F418 Other specified anxiety disorders: Secondary | ICD-10-CM | POA: Diagnosis not present

## 2019-02-05 DIAGNOSIS — K219 Gastro-esophageal reflux disease without esophagitis: Secondary | ICD-10-CM | POA: Diagnosis not present

## 2019-02-20 DIAGNOSIS — K219 Gastro-esophageal reflux disease without esophagitis: Secondary | ICD-10-CM | POA: Diagnosis not present

## 2019-02-20 DIAGNOSIS — E559 Vitamin D deficiency, unspecified: Secondary | ICD-10-CM | POA: Diagnosis not present

## 2019-02-20 DIAGNOSIS — E038 Other specified hypothyroidism: Secondary | ICD-10-CM | POA: Diagnosis not present

## 2019-02-20 DIAGNOSIS — E785 Hyperlipidemia, unspecified: Secondary | ICD-10-CM | POA: Diagnosis not present

## 2019-02-20 DIAGNOSIS — M25562 Pain in left knee: Secondary | ICD-10-CM | POA: Diagnosis not present

## 2019-02-20 DIAGNOSIS — F418 Other specified anxiety disorders: Secondary | ICD-10-CM | POA: Diagnosis not present

## 2019-02-20 DIAGNOSIS — R3915 Urgency of urination: Secondary | ICD-10-CM | POA: Diagnosis not present

## 2019-02-20 DIAGNOSIS — J302 Other seasonal allergic rhinitis: Secondary | ICD-10-CM | POA: Diagnosis not present

## 2019-06-04 DIAGNOSIS — E7800 Pure hypercholesterolemia, unspecified: Secondary | ICD-10-CM | POA: Diagnosis not present

## 2019-06-04 DIAGNOSIS — K219 Gastro-esophageal reflux disease without esophagitis: Secondary | ICD-10-CM | POA: Diagnosis not present

## 2019-06-04 DIAGNOSIS — F418 Other specified anxiety disorders: Secondary | ICD-10-CM | POA: Diagnosis not present

## 2019-06-04 DIAGNOSIS — E559 Vitamin D deficiency, unspecified: Secondary | ICD-10-CM | POA: Diagnosis not present

## 2019-06-04 DIAGNOSIS — J302 Other seasonal allergic rhinitis: Secondary | ICD-10-CM | POA: Diagnosis not present

## 2019-06-04 DIAGNOSIS — E038 Other specified hypothyroidism: Secondary | ICD-10-CM | POA: Diagnosis not present

## 2019-09-25 DIAGNOSIS — K219 Gastro-esophageal reflux disease without esophagitis: Secondary | ICD-10-CM | POA: Diagnosis not present

## 2019-09-25 DIAGNOSIS — E559 Vitamin D deficiency, unspecified: Secondary | ICD-10-CM | POA: Diagnosis not present

## 2019-09-25 DIAGNOSIS — Z131 Encounter for screening for diabetes mellitus: Secondary | ICD-10-CM | POA: Diagnosis not present

## 2019-09-25 DIAGNOSIS — N3281 Overactive bladder: Secondary | ICD-10-CM | POA: Diagnosis not present

## 2019-09-25 DIAGNOSIS — J302 Other seasonal allergic rhinitis: Secondary | ICD-10-CM | POA: Diagnosis not present

## 2019-09-25 DIAGNOSIS — Z1389 Encounter for screening for other disorder: Secondary | ICD-10-CM | POA: Diagnosis not present

## 2019-09-25 DIAGNOSIS — Z5181 Encounter for therapeutic drug level monitoring: Secondary | ICD-10-CM | POA: Diagnosis not present

## 2019-09-25 DIAGNOSIS — Z01021 Encounter for examination of eyes and vision following failed vision screening with abnormal findings: Secondary | ICD-10-CM | POA: Diagnosis not present

## 2019-09-25 DIAGNOSIS — E038 Other specified hypothyroidism: Secondary | ICD-10-CM | POA: Diagnosis not present

## 2019-09-25 DIAGNOSIS — F418 Other specified anxiety disorders: Secondary | ICD-10-CM | POA: Diagnosis not present

## 2019-09-25 DIAGNOSIS — E7800 Pure hypercholesterolemia, unspecified: Secondary | ICD-10-CM | POA: Diagnosis not present

## 2019-09-25 DIAGNOSIS — Z136 Encounter for screening for cardiovascular disorders: Secondary | ICD-10-CM | POA: Diagnosis not present

## 2019-09-25 DIAGNOSIS — Z1329 Encounter for screening for other suspected endocrine disorder: Secondary | ICD-10-CM | POA: Diagnosis not present

## 2020-01-06 ENCOUNTER — Other Ambulatory Visit: Payer: Self-pay | Admitting: Cardiology

## 2020-01-06 DIAGNOSIS — Z20822 Contact with and (suspected) exposure to covid-19: Secondary | ICD-10-CM

## 2020-01-06 DIAGNOSIS — A059 Bacterial foodborne intoxication, unspecified: Secondary | ICD-10-CM | POA: Diagnosis not present

## 2020-01-06 DIAGNOSIS — F418 Other specified anxiety disorders: Secondary | ICD-10-CM | POA: Diagnosis not present

## 2020-01-06 DIAGNOSIS — E038 Other specified hypothyroidism: Secondary | ICD-10-CM | POA: Diagnosis not present

## 2020-01-06 DIAGNOSIS — K219 Gastro-esophageal reflux disease without esophagitis: Secondary | ICD-10-CM | POA: Diagnosis not present

## 2020-01-06 DIAGNOSIS — J302 Other seasonal allergic rhinitis: Secondary | ICD-10-CM | POA: Diagnosis not present

## 2020-01-06 DIAGNOSIS — E7800 Pure hypercholesterolemia, unspecified: Secondary | ICD-10-CM | POA: Diagnosis not present

## 2020-01-06 DIAGNOSIS — E559 Vitamin D deficiency, unspecified: Secondary | ICD-10-CM | POA: Diagnosis not present

## 2020-01-06 DIAGNOSIS — R110 Nausea: Secondary | ICD-10-CM | POA: Diagnosis not present

## 2020-01-06 DIAGNOSIS — N3281 Overactive bladder: Secondary | ICD-10-CM | POA: Diagnosis not present

## 2020-01-07 LAB — NOVEL CORONAVIRUS, NAA: SARS-CoV-2, NAA: NOT DETECTED

## 2020-01-13 DIAGNOSIS — N3281 Overactive bladder: Secondary | ICD-10-CM | POA: Diagnosis not present

## 2020-01-13 DIAGNOSIS — F418 Other specified anxiety disorders: Secondary | ICD-10-CM | POA: Diagnosis not present

## 2020-01-13 DIAGNOSIS — E875 Hyperkalemia: Secondary | ICD-10-CM | POA: Diagnosis not present

## 2020-01-13 DIAGNOSIS — E038 Other specified hypothyroidism: Secondary | ICD-10-CM | POA: Diagnosis not present

## 2020-01-13 DIAGNOSIS — J302 Other seasonal allergic rhinitis: Secondary | ICD-10-CM | POA: Diagnosis not present

## 2020-01-13 DIAGNOSIS — E7800 Pure hypercholesterolemia, unspecified: Secondary | ICD-10-CM | POA: Diagnosis not present

## 2020-01-13 DIAGNOSIS — K219 Gastro-esophageal reflux disease without esophagitis: Secondary | ICD-10-CM | POA: Diagnosis not present

## 2020-01-13 DIAGNOSIS — E559 Vitamin D deficiency, unspecified: Secondary | ICD-10-CM | POA: Diagnosis not present

## 2020-01-26 ENCOUNTER — Ambulatory Visit: Payer: Medicare Other | Attending: Internal Medicine

## 2020-01-26 DIAGNOSIS — Z23 Encounter for immunization: Secondary | ICD-10-CM

## 2020-01-26 NOTE — Progress Notes (Signed)
   Covid-19 Vaccination Clinic  Name:  Cheryl Robles    MRN: [id number] DOB: November 06, 1951  01/26/2020  Cheryl Robles was observed post Covid-19 immunization for 15 minutes without incidence. She was provided with Vaccine Information Sheet and instruction to access the V-Safe system.   Cheryl Robles was instructed to call 911 with any severe reactions post vaccine: Marland Kitchen Difficulty breathing  . Swelling of your face and throat  . A fast heartbeat  . A bad rash all over your body  . Dizziness and weakness    Immunizations Administered    Name Date Dose VIS Date Route   Pfizer COVID-19 Vaccine 01/26/2020  4:19 PM 0.3 mL 11/21/2019 Intramuscular   Manufacturer: [employer], [employer]   Lot: [id number]   NDC: [address]-6

## 2020-02-06 DIAGNOSIS — Z0001 Encounter for general adult medical examination with abnormal findings: Secondary | ICD-10-CM | POA: Diagnosis not present

## 2020-02-06 DIAGNOSIS — E7800 Pure hypercholesterolemia, unspecified: Secondary | ICD-10-CM | POA: Diagnosis not present

## 2020-02-06 DIAGNOSIS — F418 Other specified anxiety disorders: Secondary | ICD-10-CM | POA: Diagnosis not present

## 2020-02-06 DIAGNOSIS — K219 Gastro-esophageal reflux disease without esophagitis: Secondary | ICD-10-CM | POA: Diagnosis not present

## 2020-02-06 DIAGNOSIS — N3281 Overactive bladder: Secondary | ICD-10-CM | POA: Diagnosis not present

## 2020-02-06 DIAGNOSIS — E559 Vitamin D deficiency, unspecified: Secondary | ICD-10-CM | POA: Diagnosis not present

## 2020-02-06 DIAGNOSIS — J302 Other seasonal allergic rhinitis: Secondary | ICD-10-CM | POA: Diagnosis not present

## 2020-02-06 DIAGNOSIS — E038 Other specified hypothyroidism: Secondary | ICD-10-CM | POA: Diagnosis not present

## 2020-02-17 ENCOUNTER — Ambulatory Visit: Payer: Medicare Other | Attending: Internal Medicine

## 2020-02-18 ENCOUNTER — Ambulatory Visit: Payer: No Typology Code available for payment source

## 2020-02-23 ENCOUNTER — Ambulatory Visit: Payer: Medicare Other | Attending: Internal Medicine

## 2020-02-23 DIAGNOSIS — Z23 Encounter for immunization: Secondary | ICD-10-CM

## 2020-02-23 NOTE — Progress Notes (Signed)
   Covid-19 Vaccination Clinic  Name:  Cheryl Robles    MRN: [id number] DOB: May 21, 1951  02/23/2020  [name] was observed post Covid-19 immunization for 15 minutes without incident. She was provided with Vaccine Information Sheet and instruction to access the V-Safe system.   [name] was instructed to call 911 with any severe reactions post vaccine: Marland Kitchen Difficulty breathing  . Swelling of face and throat  . A fast heartbeat  . A bad rash all over body  . Dizziness and weakness   Immunizations Administered    Name Date Dose VIS Date Route   Pfizer COVID-19 Vaccine 02/23/2020  9:07 AM 0.3 mL 11/21/2019 Intramuscular   Manufacturer: [employer], [employer]   Lot: [id number]   NDC: [address]-8

## 2020-04-30 DIAGNOSIS — F418 Other specified anxiety disorders: Secondary | ICD-10-CM | POA: Diagnosis not present

## 2020-04-30 DIAGNOSIS — N3281 Overactive bladder: Secondary | ICD-10-CM | POA: Diagnosis not present

## 2020-04-30 DIAGNOSIS — E559 Vitamin D deficiency, unspecified: Secondary | ICD-10-CM | POA: Diagnosis not present

## 2020-04-30 DIAGNOSIS — E038 Other specified hypothyroidism: Secondary | ICD-10-CM | POA: Diagnosis not present

## 2020-04-30 DIAGNOSIS — J302 Other seasonal allergic rhinitis: Secondary | ICD-10-CM | POA: Diagnosis not present

## 2020-04-30 DIAGNOSIS — K219 Gastro-esophageal reflux disease without esophagitis: Secondary | ICD-10-CM | POA: Diagnosis not present

## 2020-04-30 DIAGNOSIS — R3129 Other microscopic hematuria: Secondary | ICD-10-CM | POA: Diagnosis not present

## 2020-04-30 DIAGNOSIS — E7800 Pure hypercholesterolemia, unspecified: Secondary | ICD-10-CM | POA: Diagnosis not present

## 2020-10-16 DIAGNOSIS — Z23 Encounter for immunization: Secondary | ICD-10-CM | POA: Diagnosis not present

## 2020-11-01 DIAGNOSIS — R7989 Other specified abnormal findings of blood chemistry: Secondary | ICD-10-CM | POA: Diagnosis not present

## 2020-11-01 DIAGNOSIS — Z0000 Encounter for general adult medical examination without abnormal findings: Secondary | ICD-10-CM | POA: Diagnosis not present

## 2020-11-01 DIAGNOSIS — Z79899 Other long term (current) drug therapy: Secondary | ICD-10-CM | POA: Diagnosis not present

## 2020-11-01 DIAGNOSIS — R32 Unspecified urinary incontinence: Secondary | ICD-10-CM | POA: Diagnosis not present

## 2020-11-01 DIAGNOSIS — J019 Acute sinusitis, unspecified: Secondary | ICD-10-CM | POA: Diagnosis not present

## 2020-11-01 DIAGNOSIS — E039 Hypothyroidism, unspecified: Secondary | ICD-10-CM | POA: Diagnosis not present

## 2020-11-01 DIAGNOSIS — K219 Gastro-esophageal reflux disease without esophagitis: Secondary | ICD-10-CM | POA: Diagnosis not present

## 2020-11-01 DIAGNOSIS — B9689 Other specified bacterial agents as the cause of diseases classified elsewhere: Secondary | ICD-10-CM | POA: Diagnosis not present

## 2020-11-01 DIAGNOSIS — J329 Chronic sinusitis, unspecified: Secondary | ICD-10-CM | POA: Diagnosis not present

## 2020-11-01 DIAGNOSIS — Z1159 Encounter for screening for other viral diseases: Secondary | ICD-10-CM | POA: Diagnosis not present

## 2020-11-01 DIAGNOSIS — L409 Psoriasis, unspecified: Secondary | ICD-10-CM | POA: Diagnosis not present

## 2020-11-01 DIAGNOSIS — E663 Overweight: Secondary | ICD-10-CM | POA: Diagnosis not present

## 2020-11-23 DIAGNOSIS — Z79899 Other long term (current) drug therapy: Secondary | ICD-10-CM | POA: Diagnosis not present

## 2020-11-23 DIAGNOSIS — F419 Anxiety disorder, unspecified: Secondary | ICD-10-CM | POA: Diagnosis not present

## 2020-11-23 DIAGNOSIS — G4700 Insomnia, unspecified: Secondary | ICD-10-CM | POA: Diagnosis not present

## 2020-11-23 DIAGNOSIS — J329 Chronic sinusitis, unspecified: Secondary | ICD-10-CM | POA: Diagnosis not present

## 2020-11-23 DIAGNOSIS — E559 Vitamin D deficiency, unspecified: Secondary | ICD-10-CM | POA: Diagnosis not present

## 2020-11-23 DIAGNOSIS — L409 Psoriasis, unspecified: Secondary | ICD-10-CM | POA: Diagnosis not present

## 2020-11-23 DIAGNOSIS — G629 Polyneuropathy, unspecified: Secondary | ICD-10-CM | POA: Diagnosis not present

## 2020-11-23 DIAGNOSIS — R7989 Other specified abnormal findings of blood chemistry: Secondary | ICD-10-CM | POA: Diagnosis not present

## 2020-11-23 DIAGNOSIS — E785 Hyperlipidemia, unspecified: Secondary | ICD-10-CM | POA: Diagnosis not present

## 2020-11-23 DIAGNOSIS — F32A Depression, unspecified: Secondary | ICD-10-CM | POA: Diagnosis not present

## 2020-11-23 DIAGNOSIS — N390 Urinary tract infection, site not specified: Secondary | ICD-10-CM | POA: Diagnosis not present

## 2020-11-23 DIAGNOSIS — E039 Hypothyroidism, unspecified: Secondary | ICD-10-CM | POA: Diagnosis not present

## 2020-11-23 DIAGNOSIS — E663 Overweight: Secondary | ICD-10-CM | POA: Diagnosis not present

## 2020-12-16 ENCOUNTER — Other Ambulatory Visit: Payer: Self-pay | Admitting: Student

## 2020-12-16 DIAGNOSIS — Z1231 Encounter for screening mammogram for malignant neoplasm of breast: Secondary | ICD-10-CM

## 2020-12-16 DIAGNOSIS — E2839 Other primary ovarian failure: Secondary | ICD-10-CM

## 2021-07-26 ENCOUNTER — Ambulatory Visit
Admission: RE | Admit: 2021-07-26 | Discharge: 2021-07-26 | Disposition: A | Discharge status: Home / Self Care | Payer: Medicare Other | Source: Ambulatory Visit | Attending: Student | Admitting: Student

## 2021-07-26 ENCOUNTER — Other Ambulatory Visit: Payer: Self-pay | Admitting: Student

## 2021-07-26 DIAGNOSIS — M25562 Pain in left knee: Secondary | ICD-10-CM

## 2021-08-26 ENCOUNTER — Other Ambulatory Visit: Payer: Self-pay | Admitting: Student

## 2021-08-26 DIAGNOSIS — Z780 Asymptomatic menopausal state: Secondary | ICD-10-CM

## 2021-09-09 ENCOUNTER — Other Ambulatory Visit: Payer: Medicare Other

## 2021-09-09 ENCOUNTER — Inpatient Hospital Stay: Admission: RE | Admit: 2021-09-09 | Payer: Medicare Other | Source: Ambulatory Visit

## 2021-09-21 ENCOUNTER — Other Ambulatory Visit: Payer: Self-pay | Admitting: Registered Nurse

## 2021-09-21 DIAGNOSIS — R911 Solitary pulmonary nodule: Secondary | ICD-10-CM

## 2021-09-21 DIAGNOSIS — I700 Atherosclerosis of aorta: Secondary | ICD-10-CM

## 2021-09-22 ENCOUNTER — Ambulatory Visit
Admission: RE | Admit: 2021-09-22 | Discharge: 2021-09-22 | Disposition: A | Discharge status: Home / Self Care | Payer: Medicare Other | Source: Ambulatory Visit | Attending: Student | Admitting: Student

## 2021-09-22 ENCOUNTER — Other Ambulatory Visit: Payer: Self-pay

## 2021-09-22 DIAGNOSIS — Z1231 Encounter for screening mammogram for malignant neoplasm of breast: Secondary | ICD-10-CM

## 2021-09-22 DIAGNOSIS — Z780 Asymptomatic menopausal state: Secondary | ICD-10-CM

## 2021-10-01 ENCOUNTER — Other Ambulatory Visit: Payer: Self-pay

## 2021-10-01 ENCOUNTER — Ambulatory Visit
Admission: RE | Admit: 2021-10-01 | Discharge: 2021-10-01 | Disposition: A | Discharge status: Home / Self Care | Payer: Medicare Other | Source: Ambulatory Visit | Attending: Registered Nurse | Admitting: Registered Nurse

## 2021-10-01 DIAGNOSIS — R911 Solitary pulmonary nodule: Secondary | ICD-10-CM

## 2021-10-01 DIAGNOSIS — I700 Atherosclerosis of aorta: Secondary | ICD-10-CM

## 2021-12-17 IMAGING — CT CT CHEST W/O CM
1 series · 15 of 34 positions shown, 19 images · non-contrast
Comparison: July 03, 2018

CLINICAL DATA: Pulmonary nodule

EXAM:
CT CHEST WITHOUT CONTRAST
TECHNIQUE: Multidetector CT imaging of the chest was performed following the
standard protocol without IV contrast.

[Series 2: chest w/(date) · axial · 0.67mm/px · z∈[-286,-24]mm · 15 of 155 slices shown, 19 images]
[im 12/155  mediastinal]
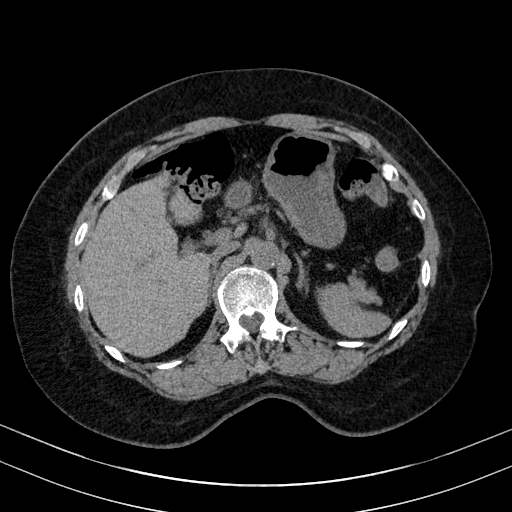
[im 12/155  lung]
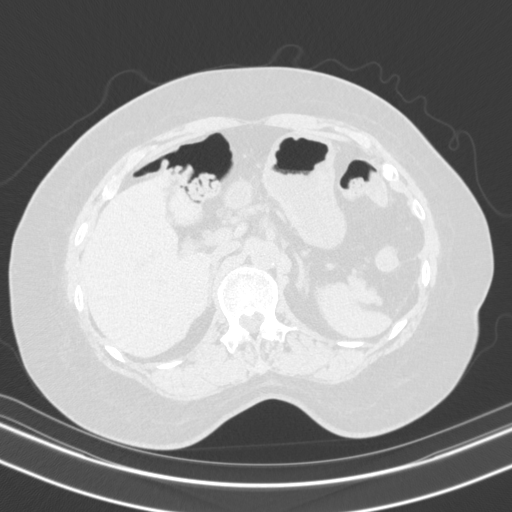
[im 23/155  lung]
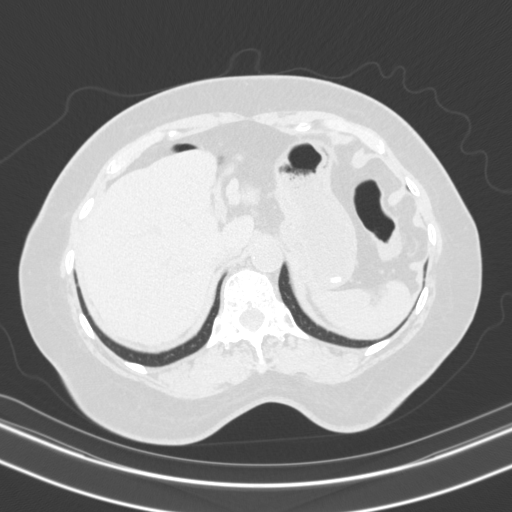
[im 31/155  lung]
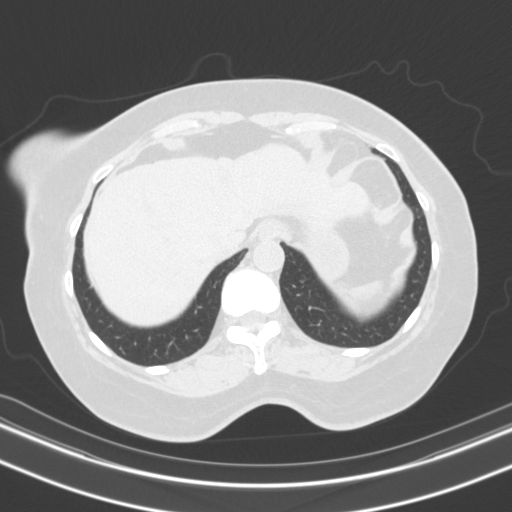
[im 40/155  lung]
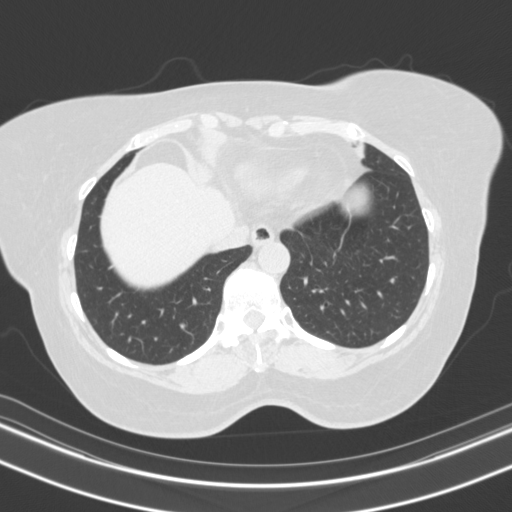
[im 52/155  mediastinal]
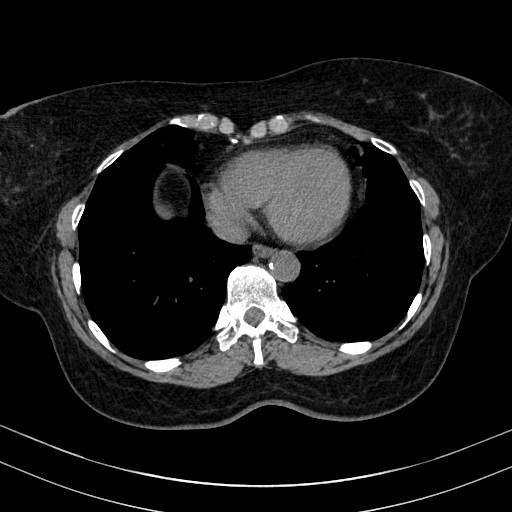
[im 52/155  lung]
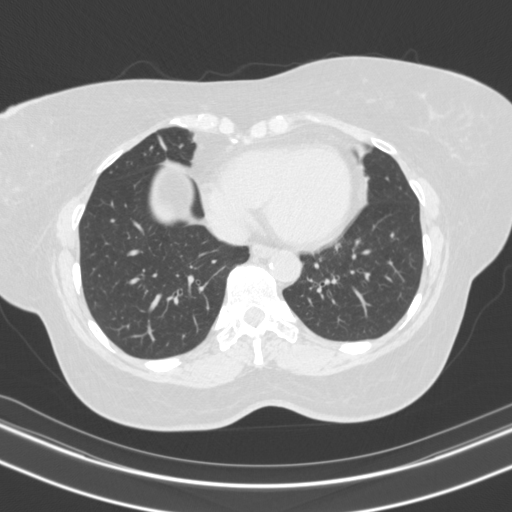
[im 62/155  lung]
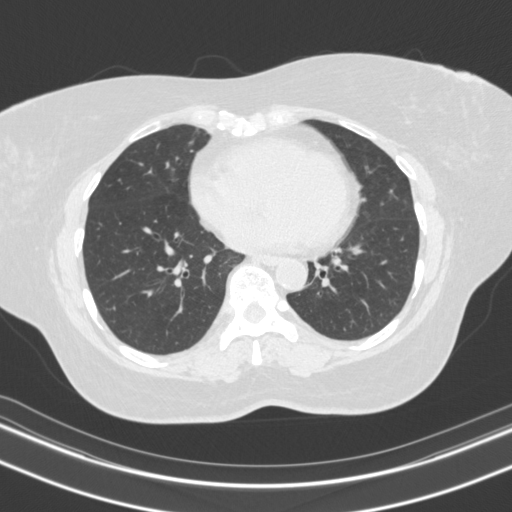
[im 69/155  lung]
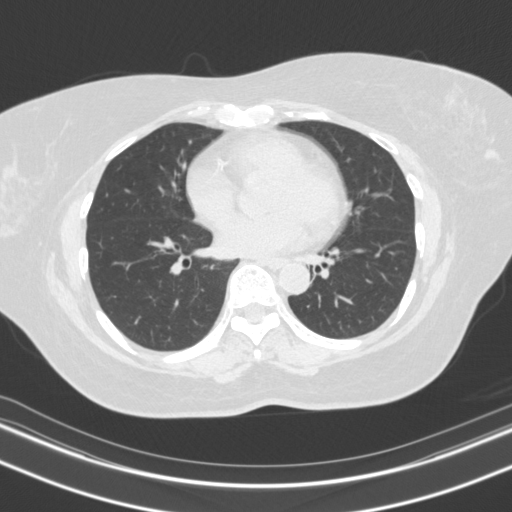
[im 80/155  lung]
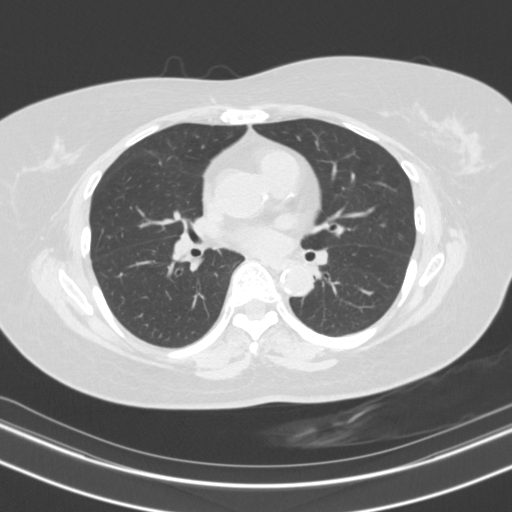
[im 86/155  mediastinal]
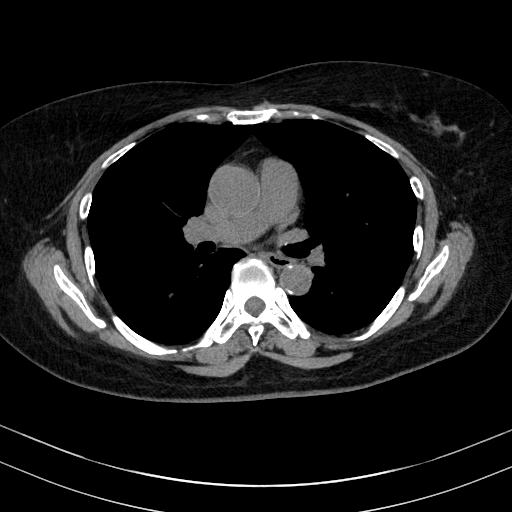
[im 86/155  lung]
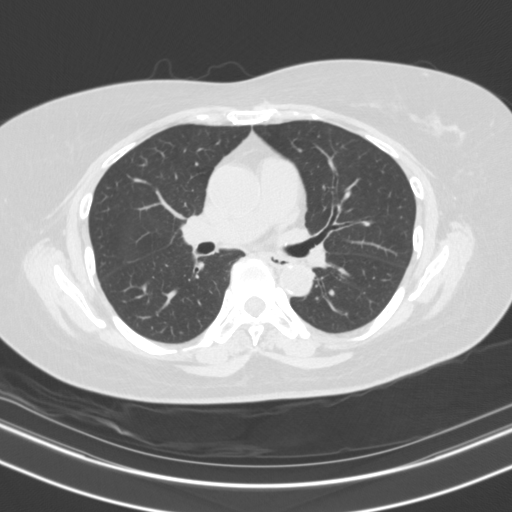
[im 93/155  lung]
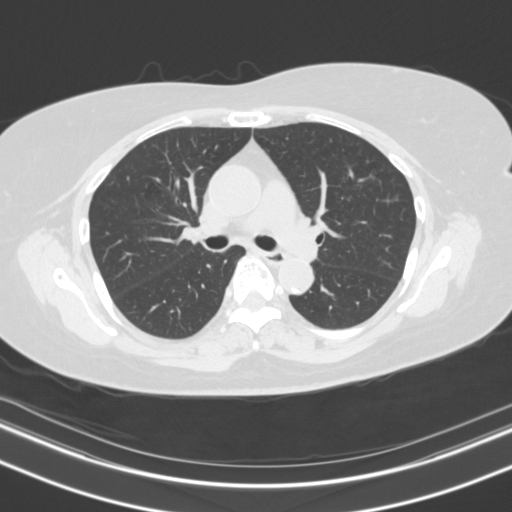
[im 103/155  lung]
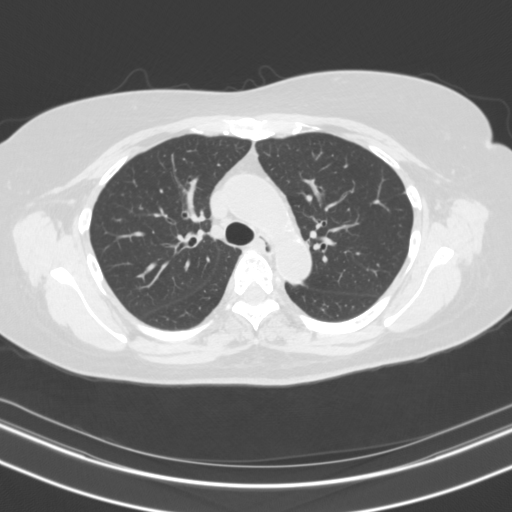
[im 115/155  lung]
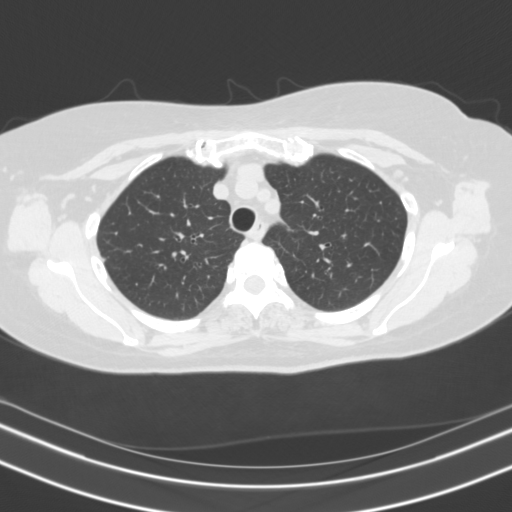
[im 124/155  mediastinal]
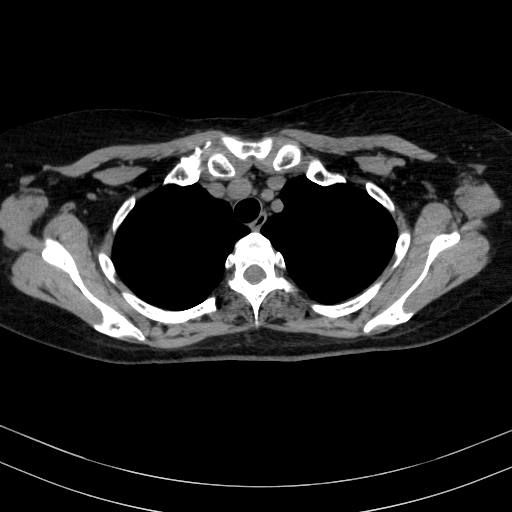
[im 124/155  lung]
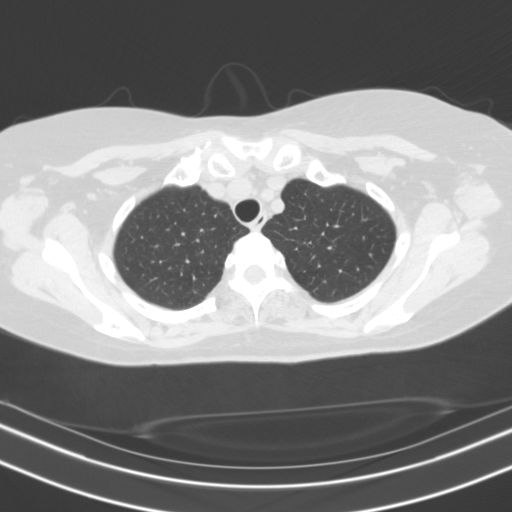
[im 132/155  lung]
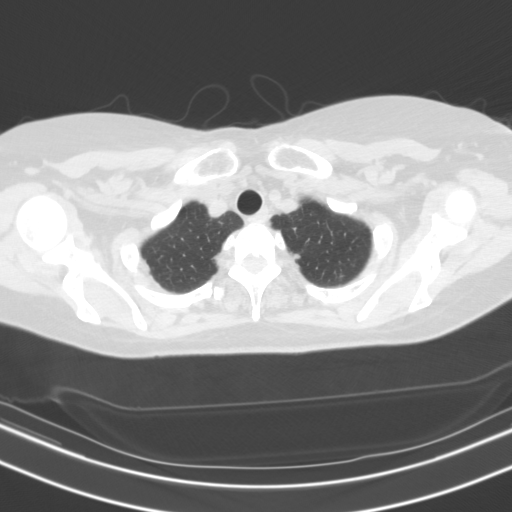
[im 143/155  lung]
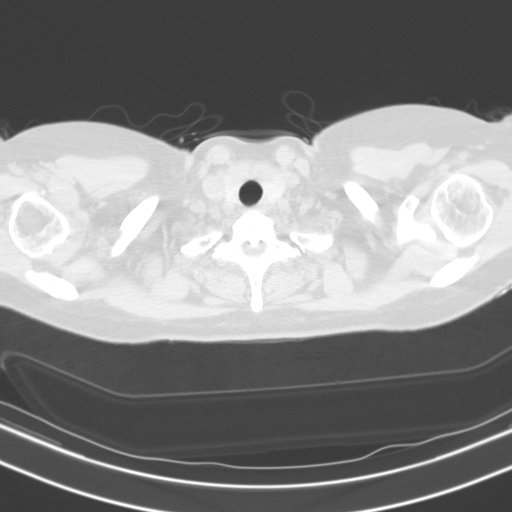

[15 of 34 positions shown; findings below may reference images not displayed]

FINDINGS: Cardiovascular: Heart is normal in size. No pericardial effusion.
Predominately LEFT-sided coronary artery atherosclerotic
calcifications. Moderate atherosclerotic calcifications of the
nonaneurysmal aorta.

Mediastinum/Nodes: Visualized thyroid is unremarkable. No axillary
or mediastinal adenopathy.

Lungs/Pleura: No pleural effusion or pneumothorax. Unchanged
biapical scarring. Minimal scattered atelectasis. Unchanged 3 mm
pulmonary nodule adjacent to the fissure of the RIGHT upper lobe
(series 3, image 72). No new suspicious pulmonary masses.

Upper Abdomen: Nodular contour of the liver could reflect underlying
cirrhosis. Proteinaceous/hemorrhagic cyst of the superior pole of
the LEFT kidney. Small hiatal hernia.

Musculoskeletal: No acute osseous abnormality.
IMPRESSION: 1. Stable appearance of a 3 mm RIGHT upper lobe pulmonary nodule for
greater than 2 years, consistent with a benign etiology.
2. Nodular contour of the liver could reflect underlying cirrhosis.

Aortic Atherosclerosis ([id number]).

## 2022-01-12 ENCOUNTER — Other Ambulatory Visit: Payer: Self-pay | Admitting: Student

## 2022-01-12 ENCOUNTER — Ambulatory Visit
Admission: RE | Admit: 2022-01-12 | Discharge: 2022-01-12 | Disposition: A | Discharge status: Home / Self Care | Payer: Medicare PPO | Source: Ambulatory Visit | Attending: Student | Admitting: Student

## 2022-01-12 DIAGNOSIS — R053 Chronic cough: Secondary | ICD-10-CM

## 2022-05-17 ENCOUNTER — Emergency Department (HOSPITAL_COMMUNITY): Payer: Medicare PPO

## 2022-05-17 ENCOUNTER — Encounter (HOSPITAL_COMMUNITY): Payer: Self-pay

## 2022-05-17 ENCOUNTER — Emergency Department (HOSPITAL_COMMUNITY)
Admission: EM | Admit: 2022-05-17 | Discharge: 2022-05-17 | Disposition: A | Discharge status: Home / Self Care | Payer: Medicare PPO | Attending: Emergency Medicine | Admitting: Emergency Medicine

## 2022-05-17 DIAGNOSIS — W19XXXA Unspecified fall, initial encounter: Secondary | ICD-10-CM | POA: Insufficient documentation

## 2022-05-17 DIAGNOSIS — E875 Hyperkalemia: Secondary | ICD-10-CM | POA: Insufficient documentation

## 2022-05-17 DIAGNOSIS — S060X0A Concussion without loss of consciousness, initial encounter: Secondary | ICD-10-CM | POA: Diagnosis present

## 2022-05-17 DIAGNOSIS — Z7982 Long term (current) use of aspirin: Secondary | ICD-10-CM | POA: Insufficient documentation

## 2022-05-17 LAB — URINALYSIS, ROUTINE W REFLEX MICROSCOPIC
Bilirubin Urine: NEGATIVE
Glucose, UA: NEGATIVE mg/dL
Hgb urine dipstick: NEGATIVE
Ketones, ur: NEGATIVE mg/dL
Leukocytes,Ua: NEGATIVE
Nitrite: NEGATIVE
Protein, ur: NEGATIVE mg/dL
Specific Gravity, Urine: 1.019 (ref 1.005–1.030)
pH: 5 (ref 5.0–8.0)

## 2022-05-17 LAB — CBC WITH DIFFERENTIAL/PLATELET
Abs Immature Granulocytes: 0.02 10*3/uL (ref 0.00–0.07)
Basophils Absolute: 0.1 10*3/uL (ref 0.0–0.1)
Basophils Relative: 1 %
Eosinophils Absolute: 0.1 10*3/uL (ref 0.0–0.5)
Eosinophils Relative: 2 %
HCT: 38.4 % (ref 36.0–46.0)
Hemoglobin: 12.5 g/dL (ref 12.0–15.0)
Immature Granulocytes: 0 %
Lymphocytes Relative: 27 %
Lymphs Abs: 1.6 10*3/uL (ref 0.7–4.0)
MCH: 25.6 pg — ABNORMAL LOW (ref 26.0–34.0)
MCHC: 32.6 g/dL (ref 30.0–36.0)
MCV: 78.7 fL — ABNORMAL LOW (ref 80.0–100.0)
Monocytes Absolute: 0.5 10*3/uL (ref 0.1–1.0)
Monocytes Relative: 8 %
Neutro Abs: 3.5 10*3/uL (ref 1.7–7.7)
Neutrophils Relative %: 62 %
Platelets: 165 10*3/uL (ref 150–400)
RBC: 4.88 MIL/uL (ref 3.87–5.11)
RDW: 14.6 % (ref 11.5–15.5)
WBC: 5.8 10*3/uL (ref 4.0–10.5)
nRBC: 0 % (ref 0.0–0.2)

## 2022-05-17 LAB — BASIC METABOLIC PANEL
Anion gap: 3 — ABNORMAL LOW (ref 5–15)
BUN: 28 mg/dL — ABNORMAL HIGH (ref 8–23)
CO2: 25 mmol/L (ref 22–32)
Calcium: 9.6 mg/dL (ref 8.9–10.3)
Chloride: 113 mmol/L — ABNORMAL HIGH (ref 98–111)
Creatinine, Ser: 1.05 mg/dL — ABNORMAL HIGH (ref 0.44–1.00)
GFR, Estimated: 57 mL/min — ABNORMAL LOW (ref >60)
Glucose, Bld: 93 mg/dL (ref 70–99)
Potassium: 5.5 mmol/L — ABNORMAL HIGH (ref 3.5–5.1)
Sodium: 141 mmol/L (ref 135–145)

## 2022-05-17 LAB — POTASSIUM: Potassium: 4.9 mmol/L (ref 3.5–5.1)

## 2022-05-17 MED ORDER — PROCHLORPERAZINE EDISYLATE 10 MG/2ML IJ SOLN
10.0000 mg | Freq: Once | INTRAMUSCULAR | Status: AC
  Administered 2022-05-17: 10 mg via INTRAVENOUS
  Filled 2022-05-17: qty 2

## 2022-05-17 MED ORDER — SODIUM ZIRCONIUM CYCLOSILICATE 10 G PO PACK
10.0000 g | PACK | Freq: Once | ORAL | Status: AC
  Administered 2022-05-17: 10 g via ORAL
  Filled 2022-05-17: qty 1

## 2022-05-17 MED ORDER — LACTATED RINGERS IV BOLUS
1000.0000 mL | Freq: Once | INTRAVENOUS | Status: AC
  Administered 2022-05-17: 1000 mL via INTRAVENOUS

## 2022-05-17 MED ORDER — ACETAMINOPHEN 325 MG PO TABS
650.0000 mg | ORAL_TABLET | Freq: Once | ORAL | Status: AC
  Administered 2022-05-17: 650 mg via ORAL
  Filled 2022-05-17: qty 2
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
## 2022-05-17 NOTE — ED Notes (Signed)
Dr. Karene Fry at the bedside
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
## 2022-05-17 NOTE — ED Provider Notes (Signed)
[facility] DEPT Provider Note   CSN: [id number] Arrival date & time: 05/17/22  1617     History  Chief Complaint  Patient presents with   Head Injury    Cheryl Robles is a 71 y.o. female.   Head Injury   71 year old female presenting to the emergency department with a chief complaint of concussive-like symptoms for the past month after a fall.  The patient states that approximately 6 weeks ago she hit her head.  She is not on any anticoagulation.  The mechanism of the injury was a heavy picture frame fell and hit the top of her head.  Since then she has had difficulty with cognition, forgetfulness, confusion, fatigue, daily headaches.  She has been having some intermittent blurry vision.  No facial droop, numbness or weakness.  Home Medications Prior to Admission medications   Medication Sig Start Date End Date Taking? Authorizing Provider  aspirin 81 MG chewable tablet Chew 1 tablet (81 mg total) by mouth daily. 03/20/14   Dorie Rank, MD  montelukast (SINGULAIR) 10 MG tablet Take 10 mg by mouth daily. 05/30/18   [provider]  Omega-3 Fatty Acids (FISH OIL PO) Take 1 tablet by mouth daily.     [provider]  Specialty Vitamins Products (LIPIDSHIELD PLUS PO) Take by mouth.    [provider]  tolterodine (DETROL LA) 2 MG 24 hr capsule TAKE 1 CAPSULE (2mg ) BY MOUTH ONCE DAILY FOR 30 DAYS 05/30/18   [provider]  TURMERIC PO Take 1 tablet by mouth daily.     [provider]      Allergies    Patient has no known allergies.    Review of Systems   Review of Systems  All other systems reviewed and are negative.   Physical Exam Updated Vital Signs BP (!) 159/68 (BP Location: Left Arm)   Pulse 68   Temp 97.7 F (36.5 C) (Oral)   Resp 16   SpO2 98%  Physical Exam Vitals and nursing note reviewed.  Constitutional:      General: She is not in acute distress. HENT:     Head: Normocephalic and  atraumatic.  Eyes:     Conjunctiva/sclera: Conjunctivae normal.     Pupils: Pupils are equal, round, and reactive to light.  Cardiovascular:     Rate and Rhythm: Normal rate and regular rhythm.  Pulmonary:     Effort: Pulmonary effort is normal. No respiratory distress.  Abdominal:     General: There is no distension.     Tenderness: There is no guarding.  Musculoskeletal:        General: No deformity or signs of injury.     Cervical back: Neck supple.  Skin:    Findings: No lesion or rash.  Neurological:     Mental Status: She is alert.     Comments: MENTAL STATUS EXAM:    Orientation: Alert and oriented to person, place and time.  Memory: Cooperative, follows commands well.  Language: Speech is clear and language is normal.   CRANIAL NERVES:    CN 2 (Optic): Visual fields intact to confrontation.  CN 3,4,6 (EOM): Pupils equal and reactive to light. Full extraocular eye movement without nystagmus.  CN 5 (Trigeminal): Facial sensation is normal, no weakness of masticatory muscles.  CN 7 (Facial): No facial weakness or asymmetry.  CN 8 (Auditory): Auditory acuity grossly normal.  CN 9,10 (Glossophar): The uvula is midline, the palate elevates symmetrically.  CN 11 (  spinal access): Normal sternocleidomastoid and trapezius strength.  CN 12 (Hypoglossal): The tongue is midline. No atrophy or fasciculations.Marland Kitchen   MOTOR:  Muscle Strength: 5/5RUE, 5/5LUE, 5/5RLE, 5/5LLE  COORDINATION:   Intact finger-to-nose, no tremor, no pronator drift.   SENSATION:   Intact to light touch all four extremities.  GAIT: Gait normal without ataxia      ED Results / Procedures / Treatments   Labs (all labs ordered are listed, but only abnormal results are displayed) Labs Reviewed  BASIC METABOLIC PANEL - Abnormal; Notable for the following components:      Result Value   Potassium 5.5 (*)    Chloride 113 (*)    BUN 28 (*)    Creatinine, Ser 1.05 (*)    GFR, Estimated 57 (*)    Anion gap 3  (*)    All other components within normal limits  CBC WITH DIFFERENTIAL/PLATELET - Abnormal; Notable for the following components:   MCV 78.7 (*)    MCH 25.6 (*)    All other components within normal limits  URINALYSIS, ROUTINE W REFLEX MICROSCOPIC - Abnormal; Notable for the following components:   APPearance HAZY (*)    All other components within normal limits  POTASSIUM    EKG EKG Interpretation  Date/Time:  Wednesday May 17 2022 21:51:48 EDT Ventricular Rate:  62 PR Interval:    QRS Duration: 106 QT Interval:  448 QTC Calculation: 454 R Axis:   63 Text Interpretation: Sinus rhythm Incomplete right bundle branch block Abnormal ECG When compared with ECG of 03-Jul-2018 16:52, PREVIOUS ECG IS PRESENT Confirmed by Regan Lemming ([id number]) on 05/17/2022 10:56:05 PM  Radiology No results found.  Procedures Procedures    Medications Ordered in ED Medications  sodium zirconium cyclosilicate (LOKELMA) packet 10 g (10 g Oral Given 05/17/22 2111)  lactated ringers bolus 1,000 mL (0 mLs Intravenous Stopped 05/17/22 2245)  acetaminophen (TYLENOL) tablet 650 mg (650 mg Oral Given 05/17/22 2116)  prochlorperazine (COMPAZINE) injection 10 mg (10 mg Intravenous Given 05/17/22 2129)    ED Course/ Medical Decision Making/ A&P Clinical Course as of 05/20/22 0726  Wed May 17, 2022  2100 Potassium(!): 5.5 [JL]    Clinical Course User Index [JL] Regan Lemming, MD                           Medical Decision Making Amount and/or Complexity of Data Reviewed Labs: ordered. Decision-making details documented in ED Course.  Risk OTC drugs. Prescription drug management.   71 year old female presenting to the emergency department with a chief complaint of concussive-like symptoms for the past month after a fall.  The patient states that approximately 6 weeks ago she hit her head.  She is not on any anticoagulation.  The mechanism of the injury was a heavy picture frame fell and hit the top of her  head.  Since then she has had difficulty with cognition, forgetfulness, confusion, fatigue, daily headaches.  She has been having some intermittent blurry vision.  No facial droop, numbness or weakness.  On arrival, the patient was vitally stable.  Presenting with roughly 6 weeks of postconcussive symptoms.  Differential diagnosis includes concussion versus subdural hematoma. Screening laboratory work-up significant for mild hyperkalemia to 5.5, slightly elevated serum creatinine to 1.05 and a BUN of 28, CBC without a leukocytosis or anemia.  The patient was administered a migraine cocktail and felt symptomatically improved.  She is currently asymptomatic on repeat assessment.  Repeat potassium  following Lokelma administration was 4.9.  CT of the head was performed which was negative.  Symptoms are most consistent with postconcussive syndrome.  Education materials provided to the patient.  We will have the patient follow-up outpatient with neurology.  Overall stable for discharge at this time.  DC Instructions: Your symptoms are due to a likely concussion after your fall 6 weeks ago.  Your CT head was without evidence of bleeding or other acute abnormality.  Your neurologic exam today is normal.  Recommend continued Tylenol for headaches and follow-up in clinic with neurology for repeat assessment.   Final Clinical Impression(s) / ED Diagnoses Final diagnoses:  Concussion without loss of consciousness, initial encounter  Fall, initial encounter    Rx / DC Orders ED Discharge Orders          Ordered    Ambulatory referral to Neurology       Comments: An appointment is requested in approximately: 2 weeks   05/17/22 2228              Regan Lemming, MD 05/20/22 [phone]

## 2022-05-17 NOTE — ED Notes (Signed)
Pt ambulated from [place] to [facility], steady gait noted, pt to [facility], husband with pt

## 2022-05-17 NOTE — ED Notes (Signed)
Husband reports "she has not been herself since the picture hit her head", reports intermittent memory loss, headaches, and more tired then normal at times.   Pt appears at baseline at current, GCS 15, able to follow commands, weakness not noted, questions answer appropriately, pt currently communicating with husband and playing on her cell phone w/o distress.

## 2022-05-17 NOTE — Discharge Instructions (Addendum)
Your symptoms are due to a likely concussion after your fall 6 weeks ago.  Your CT head was without evidence of bleeding or other acute abnormality.  Your neurologic exam today is normal.  Recommend continued Tylenol for headaches and follow-up in clinic with neurology for repeat assessment.

## 2023-10-12 ENCOUNTER — Other Ambulatory Visit: Payer: Self-pay | Admitting: Student

## 2023-10-12 ENCOUNTER — Ambulatory Visit
Admission: RE | Admit: 2023-10-12 | Discharge: 2023-10-12 | Disposition: A | Discharge status: Home / Self Care | Payer: Medicare PPO | Source: Ambulatory Visit | Attending: Student | Admitting: Student

## 2023-10-12 DIAGNOSIS — J069 Acute upper respiratory infection, unspecified: Secondary | ICD-10-CM

## 2024-09-27 ENCOUNTER — Ambulatory Visit

## 2024-09-27 DIAGNOSIS — Z23 Encounter for immunization: Secondary | ICD-10-CM
